# Patient Record
Sex: Male | Born: 1970 | Race: White | Hispanic: No | Marital: Married | Smoking: Never smoker
Health system: Southern US, Community
[De-identification: ages and names within clinical notes are randomized; demographics above are authoritative.]

## PROBLEM LIST (undated history)

## (undated) DIAGNOSIS — N200 Calculus of kidney: Secondary | ICD-10-CM

## (undated) DIAGNOSIS — E119 Type 2 diabetes mellitus without complications: Secondary | ICD-10-CM

## (undated) HISTORY — PX: FINGER SURGERY: SHX640

## (undated) HISTORY — PX: KNEE SURGERY: SHX244

---

## 2015-11-12 ENCOUNTER — Emergency Department (HOSPITAL_COMMUNITY): Payer: 59

## 2015-11-12 ENCOUNTER — Encounter (HOSPITAL_COMMUNITY): Payer: Self-pay | Admitting: Emergency Medicine

## 2015-11-12 ENCOUNTER — Emergency Department (HOSPITAL_COMMUNITY)
Admission: EM | Admit: 2015-11-12 | Discharge: 2015-11-12 | Disposition: A | Discharge status: Home / Self Care | Payer: 59 | Attending: Emergency Medicine | Admitting: Emergency Medicine

## 2015-11-12 DIAGNOSIS — N200 Calculus of kidney: Secondary | ICD-10-CM | POA: Diagnosis present

## 2015-11-12 DIAGNOSIS — E119 Type 2 diabetes mellitus without complications: Secondary | ICD-10-CM | POA: Insufficient documentation

## 2015-11-12 HISTORY — DX: Calculus of kidney: N20.0

## 2015-11-12 HISTORY — DX: Type 2 diabetes mellitus without complications: E11.9

## 2015-11-12 LAB — COMPREHENSIVE METABOLIC PANEL
ALBUMIN: 4 g/dL (ref 3.5–5.0)
ALT: 64 U/L — AB (ref 17–63)
AST: 43 U/L — AB (ref 15–41)
Alkaline Phosphatase: 50 U/L (ref 38–126)
Anion gap: 9 (ref 5–15)
BUN: 12 mg/dL (ref 6–20)
CHLORIDE: 105 mmol/L (ref 101–111)
CO2: 25 mmol/L (ref 22–32)
CREATININE: 1.32 mg/dL — AB (ref 0.61–1.24)
Calcium: 9.1 mg/dL (ref 8.9–10.3)
GFR calc non Af Amer: >60 mL/min (ref >60)
GLUCOSE: 175 mg/dL — AB (ref 65–99)
Potassium: 4 mmol/L (ref 3.5–5.1)
SODIUM: 139 mmol/L (ref 135–145)
Total Bilirubin: 0.3 mg/dL (ref 0.3–1.2)
Total Protein: 7 g/dL (ref 6.5–8.1)

## 2015-11-12 LAB — CBC WITH DIFFERENTIAL/PLATELET
BASOS ABS: 0.1 10*3/uL (ref 0.0–0.1)
BASOS PCT: 0 %
EOS ABS: 0.7 10*3/uL (ref 0.0–0.7)
EOS PCT: 6 %
HCT: 41.9 % (ref 39.0–52.0)
HEMOGLOBIN: 13.5 g/dL (ref 13.0–17.0)
Lymphocytes Relative: 38 %
Lymphs Abs: 4.8 10*3/uL — ABNORMAL HIGH (ref 0.7–4.0)
MCH: 28.5 pg (ref 26.0–34.0)
MCHC: 32.2 g/dL (ref 30.0–36.0)
MCV: 88.6 fL (ref 78.0–100.0)
Monocytes Absolute: 0.6 10*3/uL (ref 0.1–1.0)
Monocytes Relative: 5 %
NEUTROS PCT: 52 %
Neutro Abs: 6.6 10*3/uL (ref 1.7–7.7)
PLATELETS: 248 10*3/uL (ref 150–400)
RBC: 4.73 MIL/uL (ref 4.22–5.81)
RDW: 13.6 % (ref 11.5–15.5)
WBC: 12.7 10*3/uL — AB (ref 4.0–10.5)

## 2015-11-12 LAB — URINE MICROSCOPIC-ADD ON

## 2015-11-12 LAB — URINALYSIS, ROUTINE W REFLEX MICROSCOPIC
BILIRUBIN URINE: NEGATIVE
GLUCOSE, UA: 250 mg/dL — AB
Ketones, ur: NEGATIVE mg/dL
Leukocytes, UA: NEGATIVE
NITRITE: NEGATIVE
PH: 5 (ref 5.0–8.0)
Protein, ur: NEGATIVE mg/dL
SPECIFIC GRAVITY, URINE: 1.023 (ref 1.005–1.030)

## 2015-11-12 MED ORDER — OXYCODONE-ACETAMINOPHEN 5-325 MG PO TABS: 1.0000 | ORAL_TABLET | ORAL | 0 refills | Status: AC | PRN

## 2015-11-12 MED ORDER — TAMSULOSIN HCL 0.4 MG PO CAPS: 0.4000 mg | ORAL_CAPSULE | Freq: Every day | ORAL | 0 refills | Status: AC

## 2015-11-12 MED ORDER — ONDANSETRON HCL 4 MG/2ML IJ SOLN
4.0000 mg | Freq: Once | INTRAMUSCULAR | Status: AC
  Administered 2015-11-12: 4 mg via INTRAVENOUS
  Filled 2015-11-12: qty 2

## 2015-11-12 MED ORDER — KETOROLAC TROMETHAMINE 30 MG/ML IJ SOLN
30.0000 mg | Freq: Once | INTRAMUSCULAR | Status: AC
  Administered 2015-11-12: 30 mg via INTRAVENOUS
  Filled 2015-11-12: qty 1

## 2015-11-12 MED ORDER — MORPHINE SULFATE (PF) 4 MG/ML IV SOLN
4.0000 mg | Freq: Once | INTRAVENOUS | Status: AC
  Administered 2015-11-12: 4 mg via INTRAVENOUS
  Filled 2015-11-12: qty 1

## 2015-11-12 NOTE — ED Provider Notes (Signed)
[facility]-EMERGENCY DEPT Provider Note   CSN: [id number] Arrival date & time: 11/12/15  0204   By signing my name below, I, Jaime Barrera, attest that this documentation has been prepared under the direction and in the presence of Jaime Bucco, MD.  Electronically Signed: Octavia Barrera, ED Scribe. 11/12/15. 2:38 AM.   History   Chief Complaint Chief Complaint  Patient presents with  . Nephrolithiasis     The history is provided by the patient. No language interpreter was used.   HPI Comments: Jaime Barrera is a 45 y.o. male who has a PMhx of DM and nephrolithiasis presents to the Emergency Department complaining of sudden onset, recurrent, gradual worsening, left flank pain onset 4 hours ago. He notes associated urinary urgency and states when he urinates he "dribbles". Pt reports having a hx of frequent kidney stones and says this feels similar to his prior episodes. He notes usually passing the stone on his own. Pt is from out of town and had a urologist that he see's in [place], [place]. He has not had any medication to alleviate his pain. He denies nausea, vomiting, or cough.   Past Medical History:  Diagnosis Date  . Diabetes mellitus without complication (HCC)   . Nephrolithiasis     There are no active problems to display for this patient.   Past Surgical History:  Procedure Laterality Date  . FINGER SURGERY    . KNEE SURGERY         Home Medications    Prior to Admission medications   Medication Sig Start Date End Date Taking? Authorizing Provider  oxyCODONE-acetaminophen (PERCOCET) 5-325 MG tablet Take 1-2 tablets by mouth every 4 (four) hours as needed. 11/12/15   Jaime Bucco, MD  tamsulosin (FLOMAX) 0.4 MG CAPS capsule Take 1 capsule (0.4 mg total) by mouth daily. 11/12/15   Jaime Bucco, MD    Family History No family history on file.  Social History Social History  Substance Use Topics  . Smoking status: Never Smoker  . Smokeless tobacco: Never Used  .  Alcohol use No     Allergies   Review of patient's allergies indicates no known allergies.   Review of Systems Review of Systems  Constitutional: Negative for chills, diaphoresis, fatigue and fever.  HENT: Negative for congestion, rhinorrhea and sneezing.   Eyes: Negative.   Respiratory: Negative for cough, chest tightness and shortness of breath.   Cardiovascular: Negative for chest pain and leg swelling.  Gastrointestinal: Negative for abdominal pain, blood in stool, diarrhea, nausea and vomiting.  Genitourinary: Positive for difficulty urinating and flank pain. Negative for frequency and hematuria.  Musculoskeletal: Negative for arthralgias and back pain.  Skin: Negative for rash.  Neurological: Negative for dizziness, speech difficulty, weakness, numbness and headaches.     Physical Exam Updated Vital Signs BP 121/72   Pulse 60   Temp 97.7 F (36.5 C) (Oral)   Resp 21   Ht 6\' 1"  (1.854 m)   Wt 271 lb 6 oz (123.1 kg)   SpO2 95%   BMI 35.80 kg/m   Physical Exam  Constitutional: He is oriented to person, place, and time. He appears well-developed and well-nourished.  Appears uncomfortable  HENT:  Head: Normocephalic and atraumatic.  Eyes: Pupils are equal, round, and reactive to light.  Neck: Normal range of motion. Neck supple.  Cardiovascular: Normal rate, regular rhythm and normal heart sounds.   Pulmonary/Chest: Effort normal and breath sounds normal. No respiratory distress. He has no wheezes. He has  no rales. He exhibits no tenderness.  Abdominal: Soft. Bowel sounds are normal. There is no tenderness. There is no rebound and no guarding.  Moderate pain in the left flank  Musculoskeletal: Normal range of motion. He exhibits no edema.  Lymphadenopathy:    He has no cervical adenopathy.  Neurological: He is alert and oriented to person, place, and time.  Skin: Skin is warm and dry. No rash noted.  Psychiatric: He has a normal mood and affect.  Nursing note and  vitals reviewed.    ED Treatments / Results  DIAGNOSTIC STUDIES: Oxygen Saturation is 100% on RA, normal by my interpretation.  COORDINATION OF CARE:  2:36 AM Discussed treatment plan with pt at bedside and pt agreed to plan.  Labs (all labs ordered are listed, but only abnormal results are displayed) Results for orders placed or performed during the hospital encounter of 11/12/15  CBC with Differential  Result Value Ref Range   WBC 12.7 (H) 4.0 - 10.5 K/uL   RBC 4.73 4.22 - 5.81 MIL/uL   Hemoglobin 13.5 13.0 - 17.0 g/dL   HCT 16.1 09.6 - 04.5 %   MCV 88.6 78.0 - 100.0 fL   MCH 28.5 26.0 - 34.0 pg   MCHC 32.2 30.0 - 36.0 g/dL   RDW 40.9 81.1 - 91.4 %   Platelets 248 150 - 400 K/uL   Neutrophils Relative % 52 %   Neutro Abs 6.6 1.7 - 7.7 K/uL   Lymphocytes Relative 38 %   Lymphs Abs 4.8 (H) 0.7 - 4.0 K/uL   Monocytes Relative 5 %   Monocytes Absolute 0.6 0.1 - 1.0 K/uL   Eosinophils Relative 6 %   Eosinophils Absolute 0.7 0.0 - 0.7 K/uL   Basophils Relative 0 %   Basophils Absolute 0.1 0.0 - 0.1 K/uL  Comprehensive metabolic panel  Result Value Ref Range   Sodium 139 135 - 145 mmol/L   Potassium 4.0 3.5 - 5.1 mmol/L   Chloride 105 101 - 111 mmol/L   CO2 25 22 - 32 mmol/L   Glucose, Bld 175 (H) 65 - 99 mg/dL   BUN 12 6 - 20 mg/dL   Creatinine, Ser 7.82 (H) 0.61 - 1.24 mg/dL   Calcium 9.1 8.9 - 95.6 mg/dL   Total Protein 7.0 6.5 - 8.1 g/dL   Albumin 4.0 3.5 - 5.0 g/dL   AST 43 (H) 15 - 41 U/L   ALT 64 (H) 17 - 63 U/L   Alkaline Phosphatase 50 38 - 126 U/L   Total Bilirubin 0.3 0.3 - 1.2 mg/dL   GFR calc non Af Amer >60 >60 mL/min   GFR calc Af Amer >60 >60 mL/min   Anion gap 9 5 - 15  Urinalysis, Routine w reflex microscopic (not at [facility])  Result Value Ref Range   Color, Urine YELLOW YELLOW   APPearance CLEAR CLEAR   Specific Gravity, Urine 1.023 1.005 - 1.030   pH 5.0 5.0 - 8.0   Glucose, UA 250 (A) NEGATIVE mg/dL   Hgb urine dipstick LARGE (A) NEGATIVE    Bilirubin Urine NEGATIVE NEGATIVE   Ketones, ur NEGATIVE NEGATIVE mg/dL   Protein, ur NEGATIVE NEGATIVE mg/dL   Nitrite NEGATIVE NEGATIVE   Leukocytes, UA NEGATIVE NEGATIVE  Urine microscopic-add on  Result Value Ref Range   Squamous Epithelial / LPF 0-5 (A) NONE SEEN   WBC, UA 0-5 0 - 5 WBC/hpf   RBC / HPF 6-30 0 - 5 RBC/hpf   Bacteria, UA  RARE (A) NONE SEEN   Dg Abdomen 1 View  Result Date: 11/12/2015 CLINICAL DATA:  Lower left-sided bladder pain. History of left kidney stones. EXAM: ABDOMEN - 1 VIEW COMPARISON:  None. FINDINGS: No radiopaque stones demonstrated over the kidneys. Calcifications in the pelvis are likely phleboliths. Scattered gas and stool in the colon. No small or large bowel distention. Surgical clips in the right upper quadrant. Visualized bones appear intact. IMPRESSION: No radiopaque stones are identified. Calcifications in the pelvis probably represent phleboliths. Nonobstructive bowel gas pattern. Electronically Signed   By: Burman NievesWilliam  Stevens M.D.   On: 11/12/2015 03:18     EKG  EKG Interpretation None       Radiology Dg Abdomen 1 View  Result Date: 11/12/2015 CLINICAL DATA:  Lower left-sided bladder pain. History of left kidney stones. EXAM: ABDOMEN - 1 VIEW COMPARISON:  None. FINDINGS: No radiopaque stones demonstrated over the kidneys. Calcifications in the pelvis are likely phleboliths. Scattered gas and stool in the colon. No small or large bowel distention. Surgical clips in the right upper quadrant. Visualized bones appear intact. IMPRESSION: No radiopaque stones are identified. Calcifications in the pelvis probably represent phleboliths. Nonobstructive bowel gas pattern. Electronically Signed   By: Burman NievesWilliam  Stevens M.D.   On: 11/12/2015 03:18    Procedures Procedures (including critical care time)  Medications Ordered in ED Medications  ketorolac (TORADOL) 30 MG/ML injection 30 mg (30 mg Intravenous Given 11/12/15 0243)  morphine 4 MG/ML injection 4 mg  (4 mg Intravenous Given 11/12/15 0243)  ondansetron (ZOFRAN) injection 4 mg (4 mg Intravenous Given 11/12/15 0242)     Initial Impression / Assessment and Plan / ED Course  I have reviewed the triage vital signs and the nursing notes.  Pertinent labs & imaging results that were available during my care of the patient were reviewed by me and considered in my medical decision making (see chart for details).  Clinical Course    Pt's pain is controlled in the ED.  No signs of infections.  No ongoing vomiting.  His creatinine is mildly elevated, but given that pain just started tonight, I doubt it is from kidney stones.  Pt was discharged in good condition.  Was given rx for percocet and flomax.  [place] with.  Advised pt to have his creatinine rechecked by his PCP.  Return precautions given.  Final Clinical Impressions(s) / ED Diagnoses   Final diagnoses:  Nephrolithiasis   I personally performed the services described in this documentation, which was scribed in my presence.  The recorded information has been reviewed and considered.    New Prescriptions New Prescriptions   OXYCODONE-ACETAMINOPHEN (PERCOCET) 5-325 MG TABLET    Take 1-2 tablets by mouth every 4 (four) hours as needed.   TAMSULOSIN (FLOMAX) 0.4 MG CAPS CAPSULE    Take 1 capsule (0.4 mg total) by mouth daily.     Jaime BuccoMelanie Kennan Detter, MD 11/12/15 [phone]

## 2015-11-12 NOTE — Discharge Instructions (Signed)
YOUR CREATININE WAS 1.3.  THIS NEEDS TO BE FOLLOWED BY YOUR PRIMARY CARE PROVIDER.

## 2015-11-12 NOTE — ED Notes (Signed)
Pt understood dc material. NAD noted scripts given at dc 

## 2015-11-12 NOTE — ED Triage Notes (Signed)
Pt. reports left flank pain with hematuria onset this evening , pt. stated pain similar to his kidney stones in the past , denies nausea /no fever or chills.

## 2017-05-06 IMAGING — DX DG ABDOMEN 1V
2 series · 2 of 2 positions shown · non-contrast
Comparison: None.

CLINICAL DATA: Lower left-sided bladder pain. History of left
kidney stones.

EXAM:
ABDOMEN - 1 VIEW

[abdomen kub (1 of 2)]
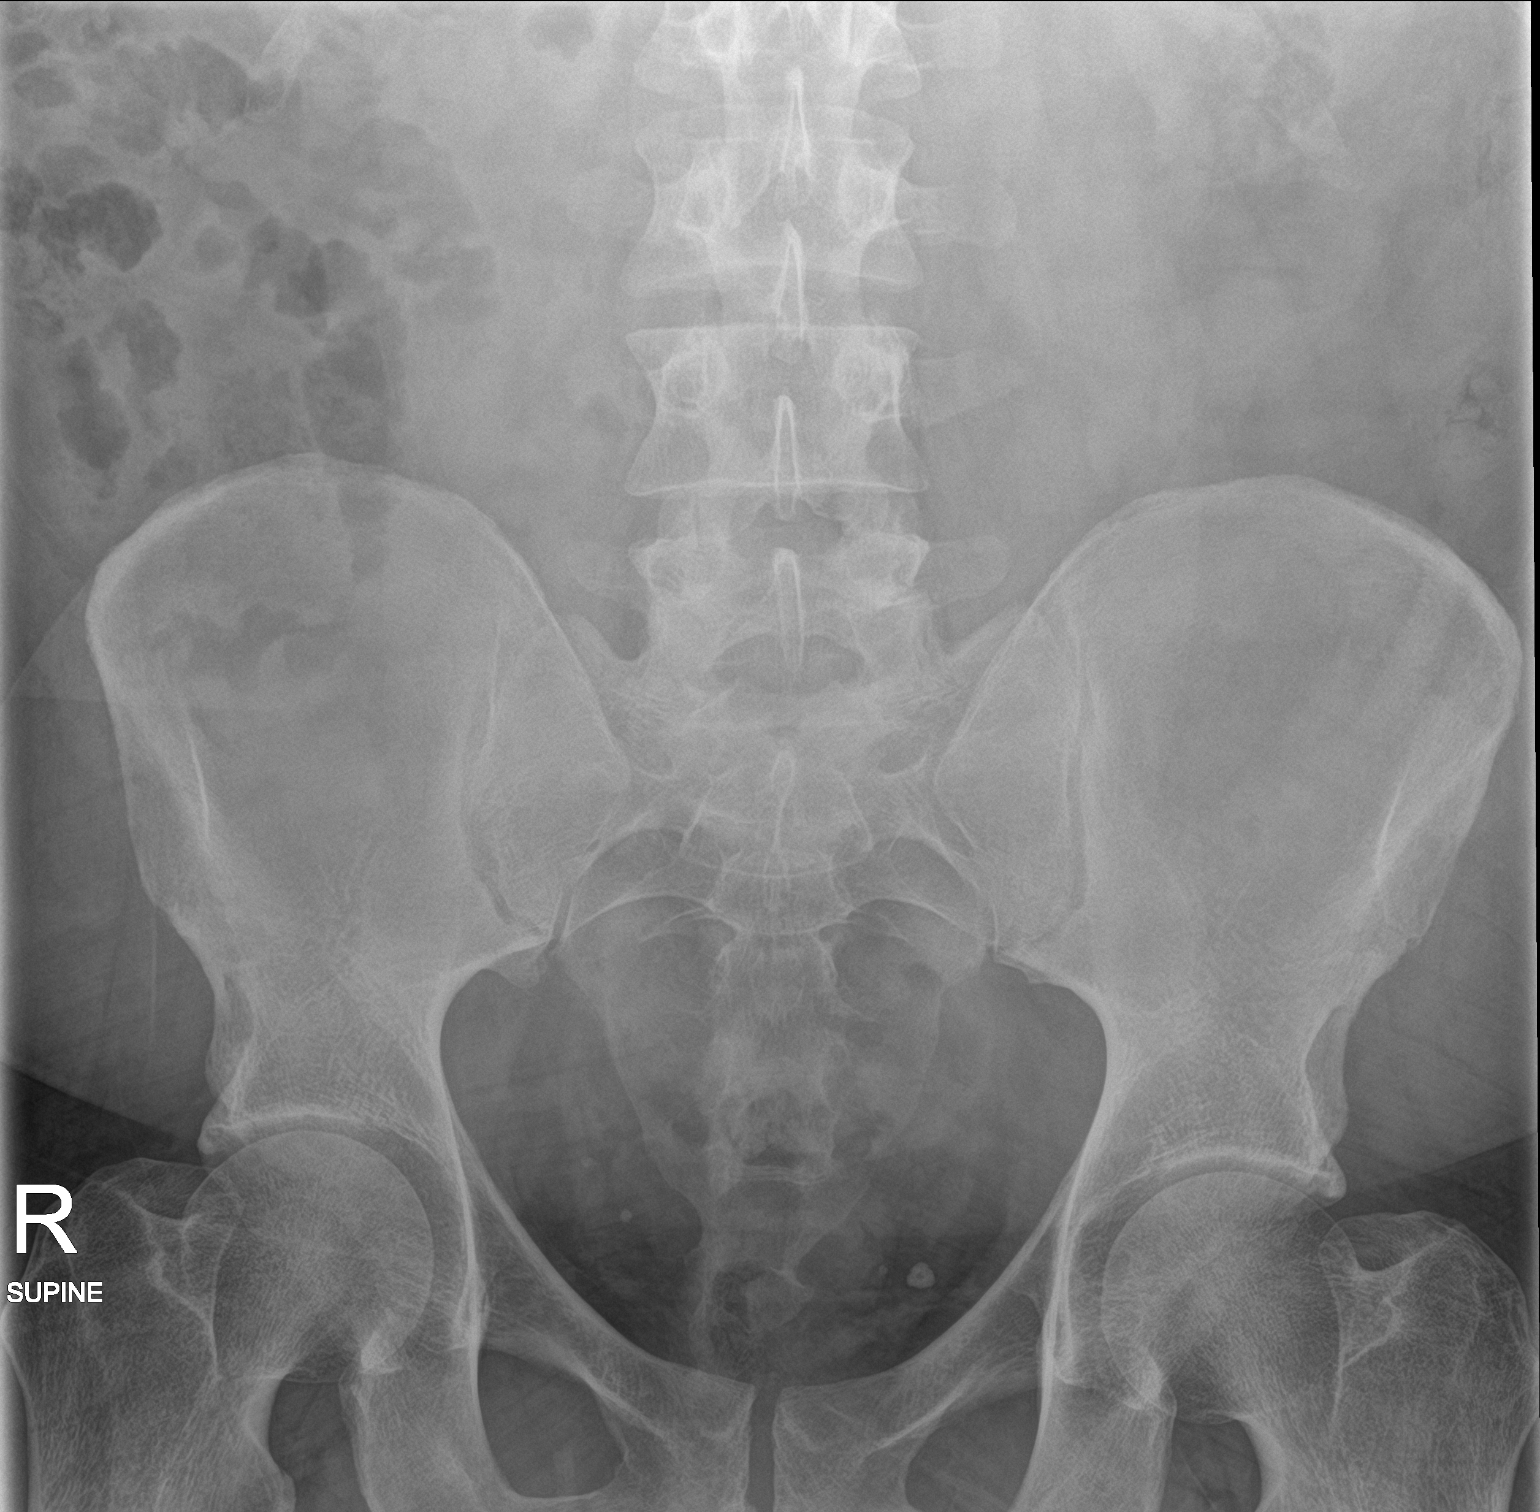

[abdomen kub (2 of 2)]
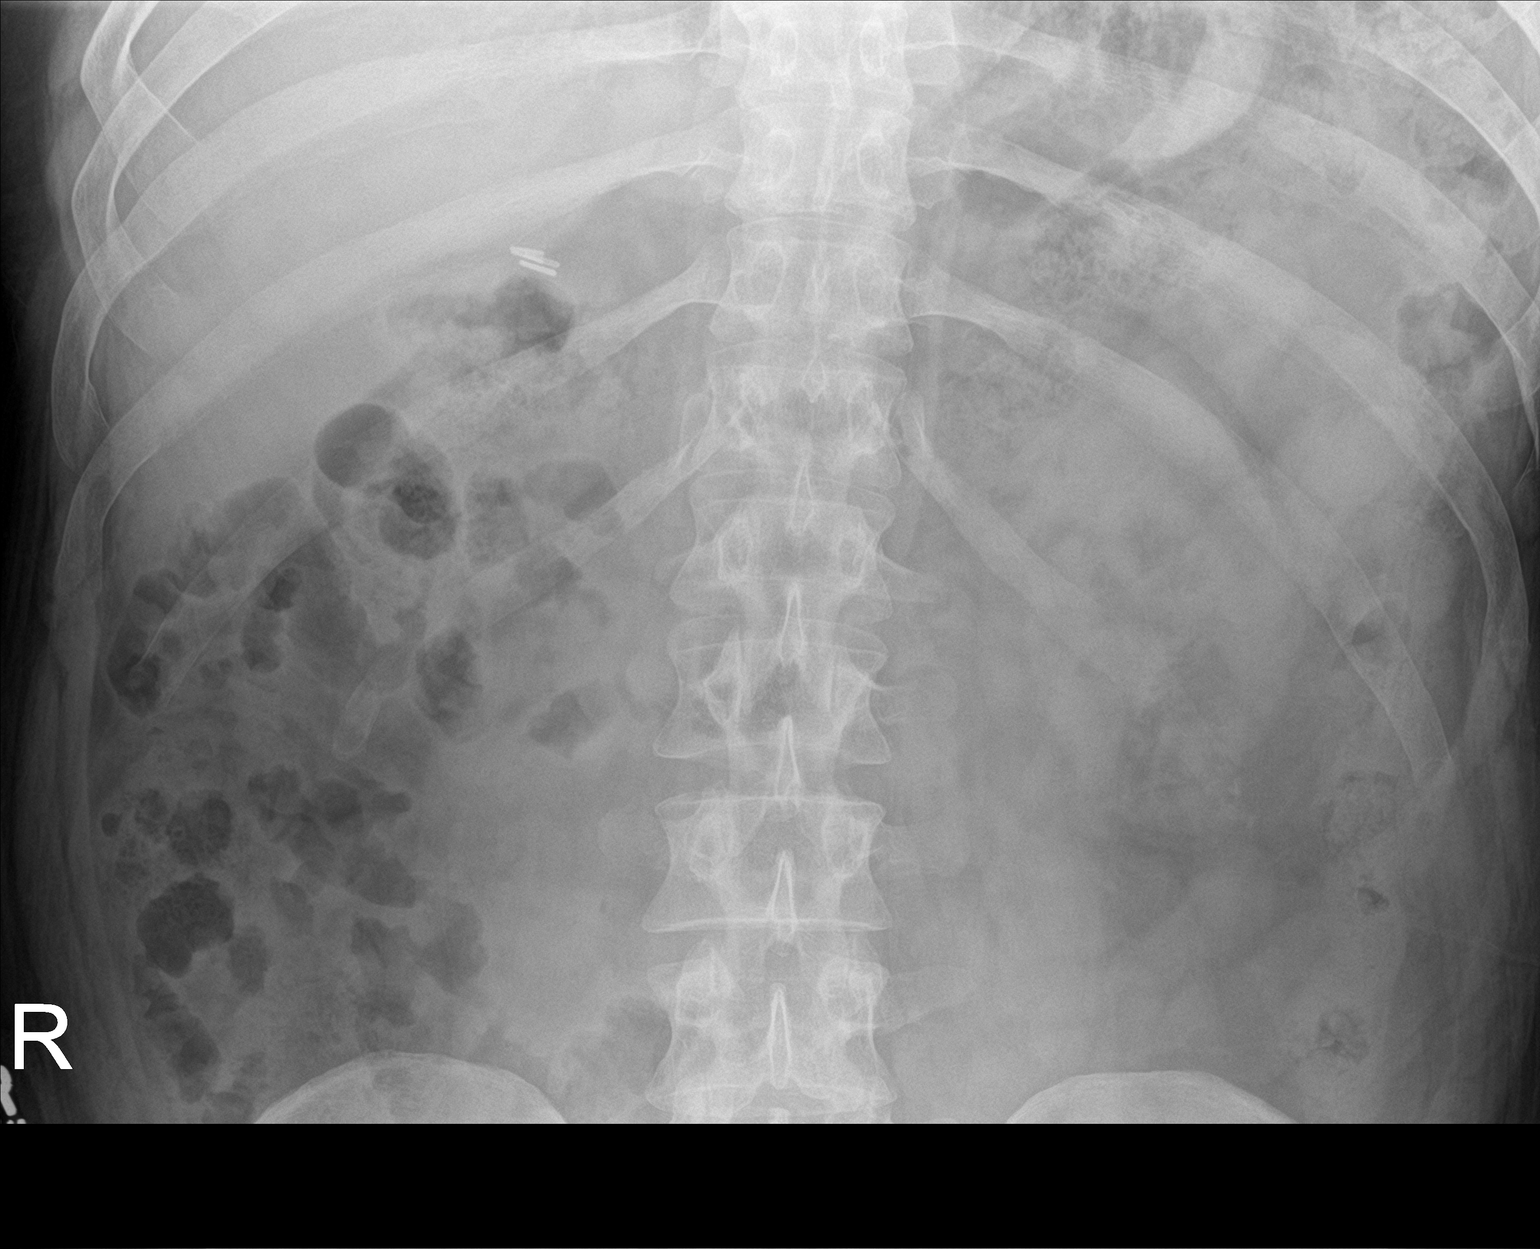

[2 of 2 positions shown; findings below may reference images not displayed]

FINDINGS: No radiopaque stones demonstrated over the kidneys. Calcifications
in the pelvis are likely phleboliths. Scattered gas and stool in the
colon. No small or large bowel distention. Surgical clips in the
right upper quadrant. Visualized bones appear intact.
IMPRESSION: No radiopaque stones are identified. Calcifications in the pelvis
probably represent phleboliths. Nonobstructive bowel gas pattern.
